# Patient Record
Sex: Female | Born: 1960 | Race: White | Hispanic: No | Marital: Married
Health system: Southern US, Community
[De-identification: ages and names within clinical notes are randomized; demographics above are authoritative.]

---

## 2005-06-03 ENCOUNTER — Other Ambulatory Visit: Admission: RE | Admit: 2005-06-03 | Discharge: 2005-06-03 | Payer: Self-pay | Admitting: Obstetrics and Gynecology

## 2006-09-23 ENCOUNTER — Encounter: Admission: RE | Admit: 2006-09-23 | Discharge: 2006-09-23 | Payer: Self-pay | Admitting: Obstetrics and Gynecology

## 2008-08-30 ENCOUNTER — Encounter: Admission: RE | Admit: 2008-08-30 | Discharge: 2008-08-30 | Payer: Self-pay | Admitting: Family Medicine

## 2008-12-19 ENCOUNTER — Encounter: Admission: RE | Admit: 2008-12-19 | Discharge: 2008-12-19 | Payer: Self-pay | Admitting: Orthopedic Surgery

## 2009-10-12 ENCOUNTER — Encounter: Admission: RE | Admit: 2009-10-12 | Discharge: 2009-10-12 | Payer: Self-pay | Admitting: Obstetrics and Gynecology

## 2010-05-22 IMAGING — CR DG MYELOGRAM LUMBAR
3 series · 3 of 3 positions shown · IV contrast (omnipaque)
Comparison: MRI 08/30/2008

CLINICAL DATA: Back pain.

 MYELOGRAM INJECTION
TECHNIQUE: Informed consent was obtained from the patient prior to
the procedure, including potential complications of headache,
allergy, infection and pain.  A timeout procedure was performed.
With the patient prone, the lower back was prepped with Betadine.
1% Lidocaine was used for local anesthesia.  Lumbar puncture was
performed at the right L3-5level using a 22 gauge needle with
return of clear CSF.  14 ml of Omnipaque 433was injected into the
subarachnoid space .
TECHNIQUE: Following injection of intrathecal Omnipaque contrast,
spine imaging in multiple projections was performed using
fluoroscopy.
Fluoroscopy Time: 1 minute 38 seconds .
TECHNIQUE: CT imaging of the lumbar spine was performed after
intrathecal contrast administration.  Multiplanar CT image
reconstructions were also generated.

[view not recorded (1 of 3)]
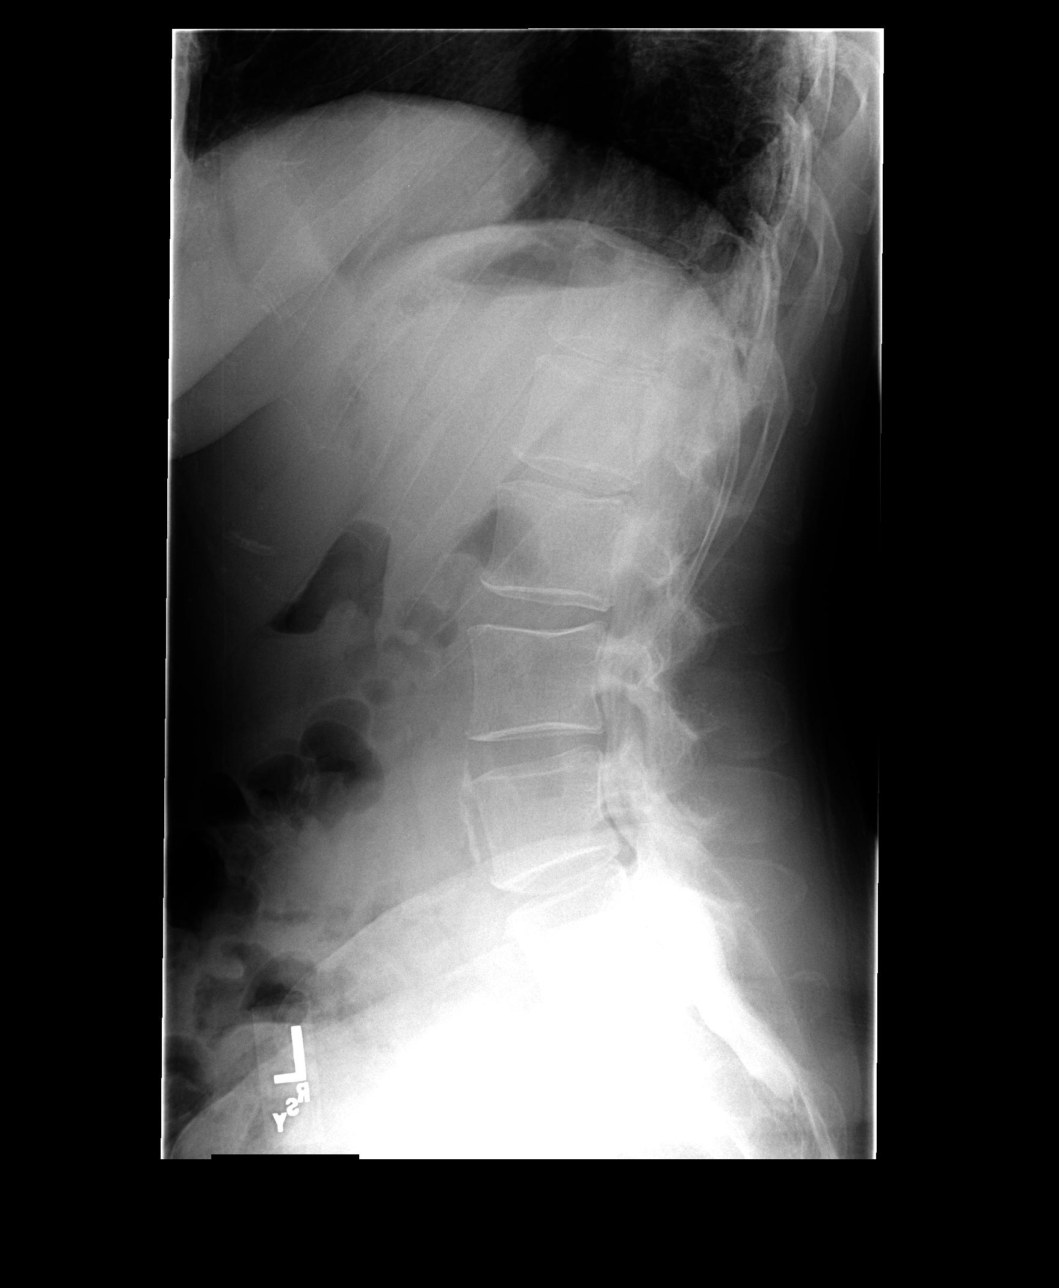

[view not recorded (2 of 3)]
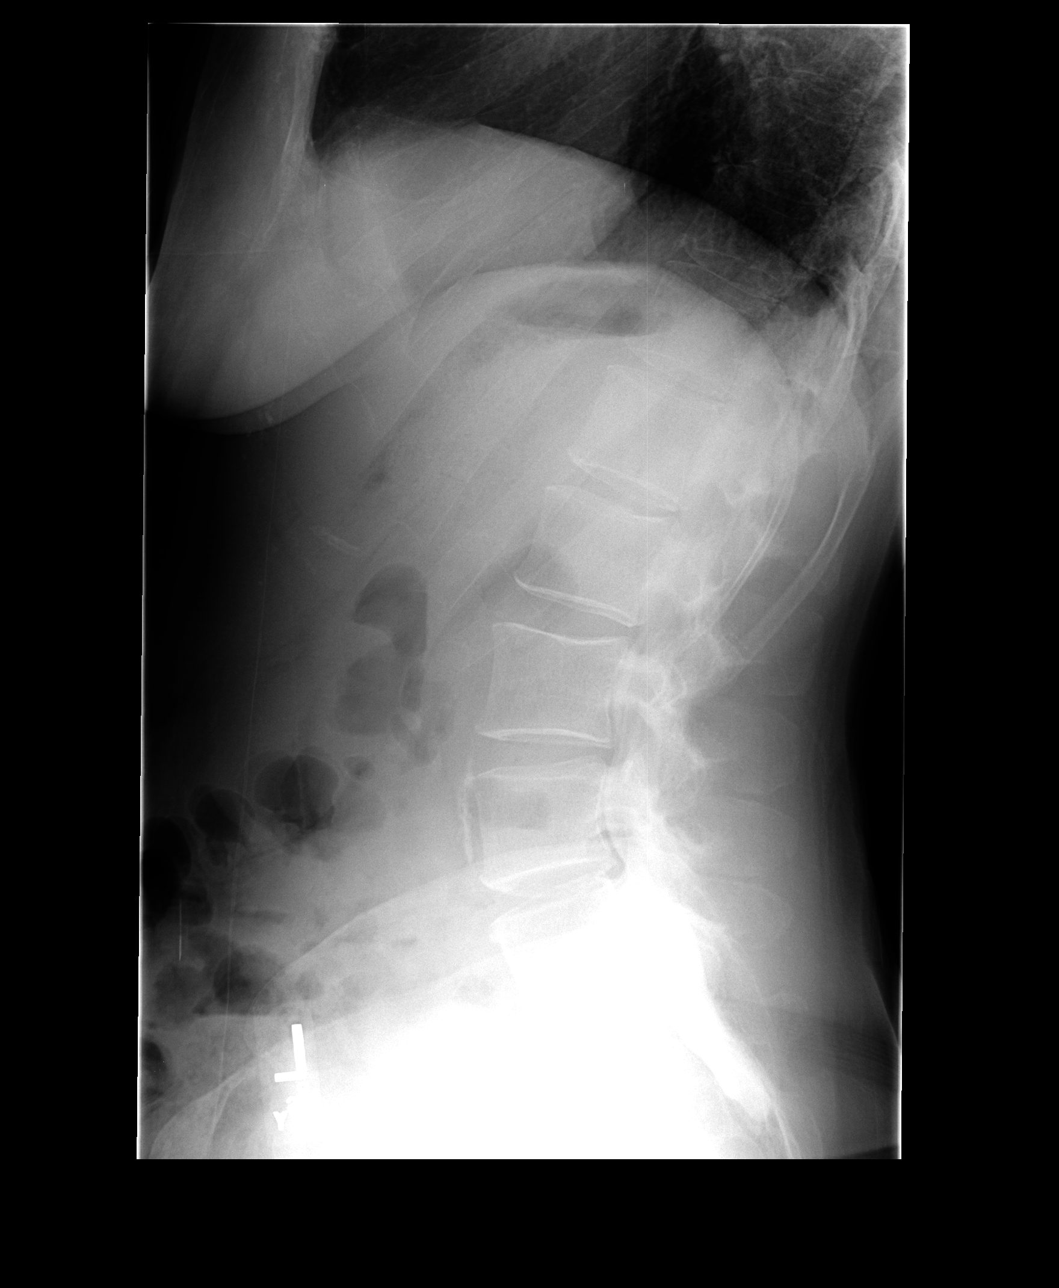

[view not recorded (3 of 3)]
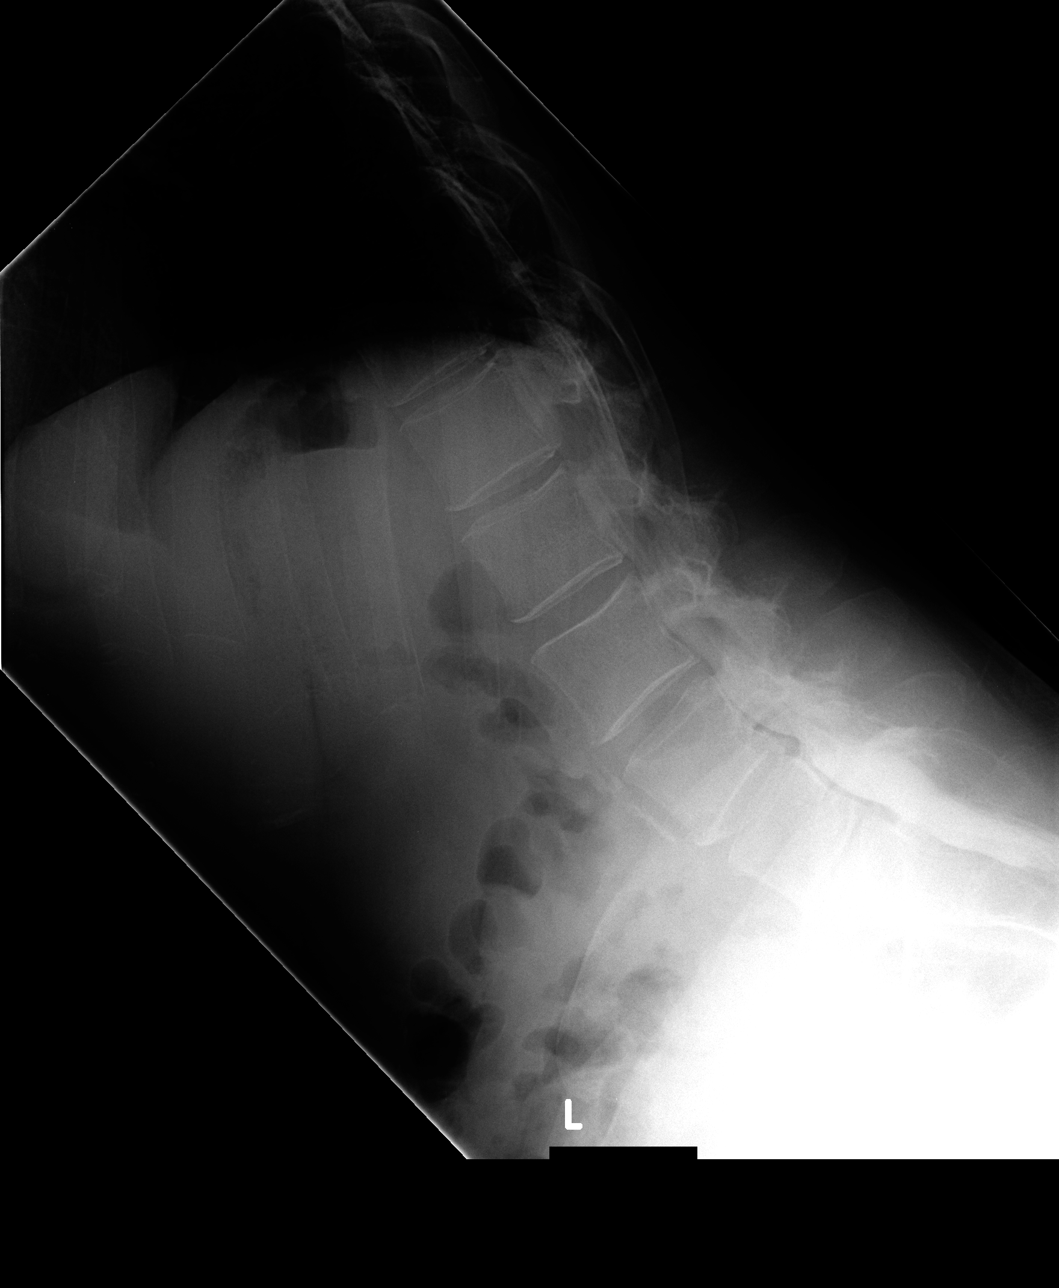

[3 of 3 positions shown; findings below may reference images not displayed]

IMPRESSION: Successful injection of  intrathecal contrast for myelography.

MYELOGRAM LUMBAR
FINDINGS: No abnormality is seen at L3-4 or above.

At L4-5, there is a moderate sized anterior extradural defect.
There is narrowing of both lateral recesses in a symmetric fashion.
Definite compression of the L5 nerve roots is not demonstrated
however.

At L5-S1, there is a small anterior extradural defect.  The left S1
root sleeve does not fill as well as the right.

Standing flexion and extension views do not show any abnormal
motion.
IMPRESSION: Moderate anterior extradural defect at L4-5 with symmetric
narrowing of the lateral recesses.

Small anterior extradural defect at L5-S1.  Slightly diminished
filling of the left S1 root sleeve.  See below.

CT MYELOGRAPHY LUMBAR SPINE
FINDINGS: There is no abnormality at T12-L1, L1-2 or L2-3.

At L3-4, there is minimal ligamentous prominence.  The canal and
foramina appear widely patent however.

At L4-5, the disc is degenerated and there is a shallow broad-based
protrusion.  The L4 nerve roots appear to exit the foramina freely.
There is mild narrowing of the lateral recesses without definite
compression of either L5 nerve root.

At L5-S1, the disc is degenerated with vacuum phenomenon.  The disc
bulges mildly.  No apparent compressive stenosis.  There is mild
foraminal narrowing bilaterally, left more than right.  Diminished
filling of the left S1 root sleeve does not appear to be on the
basis of compressive pathology.
IMPRESSION: L4-5:  Broad-based disc protrusion.  Mild narrowing of both lateral
recesses without definite neural compression.

L5-S1:  Mild bulging of the disc.  Foraminal encroachment by
osteophytes, left more than right.  No definite neural compression.

## 2010-05-22 IMAGING — CT CT L SPINE W/ CM
4 of 9 series · 13 of 33 positions shown, 15 images · IV contrast (omnipaque)
Comparison: MRI 08/30/2008

CLINICAL DATA: Back pain.

 MYELOGRAM INJECTION
TECHNIQUE: Informed consent was obtained from the patient prior to
the procedure, including potential complications of headache,
allergy, infection and pain.  A timeout procedure was performed.
With the patient prone, the lower back was prepped with Betadine.
1% Lidocaine was used for local anesthesia.  Lumbar puncture was
performed at the right L3-5level using a 22 gauge needle with
return of clear CSF.  14 ml of Omnipaque 433was injected into the
subarachnoid space .
TECHNIQUE: Following injection of intrathecal Omnipaque contrast,
spine imaging in multiple projections was performed using
fluoroscopy.
Fluoroscopy Time: 1 minute 38 seconds .
TECHNIQUE: CT imaging of the lumbar spine was performed after
intrathecal contrast administration.  Multiplanar CT image
reconstructions were also generated.

[Series 2: l spine · axial · 0.27mm/px · z∈[-175,-105]mm · 2 of 85 slices shown]
[im 29/85  bone]
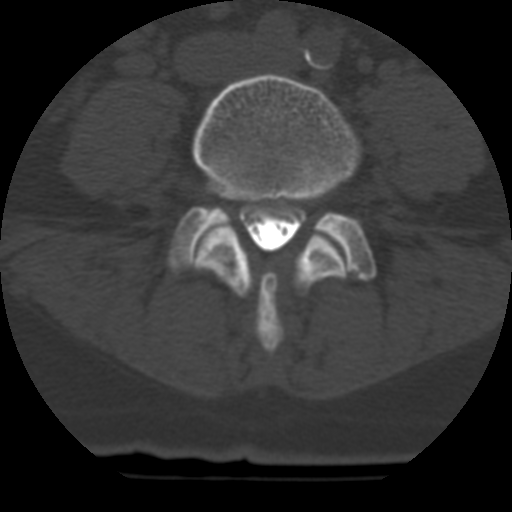
[im 57/85  bone]
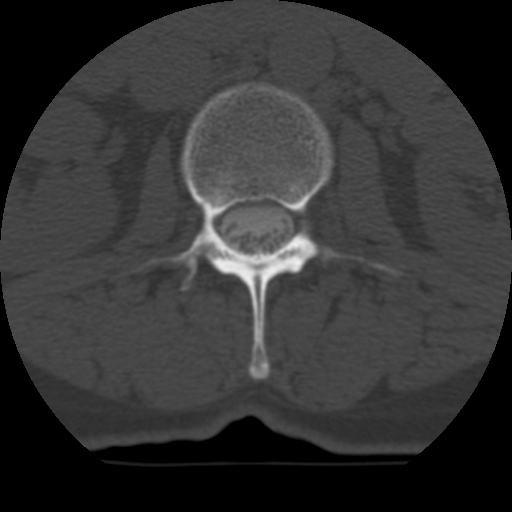

[Series 3: bone windows · axial · 0.27mm/px · z∈[-192,-87]mm · 3 of 85 slices shown, 4 images]
[im 22/85  soft-tissue]
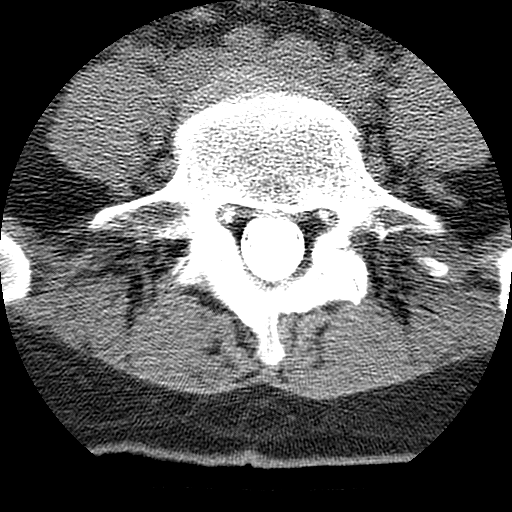
[im 22/85  bone]
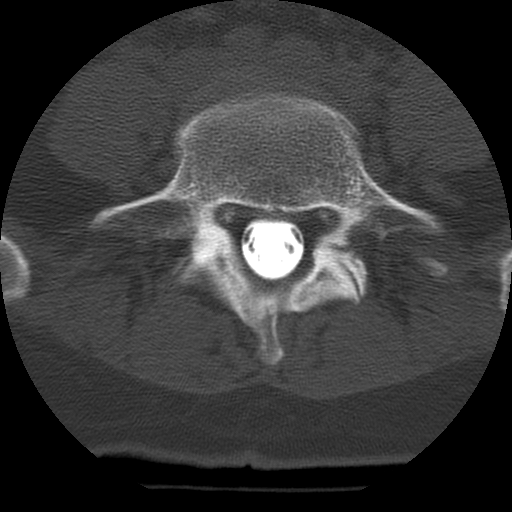
[im 43/85  bone]
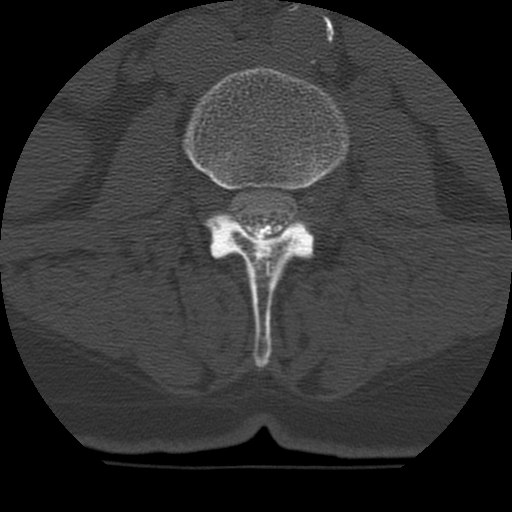
[im 64/85  bone]
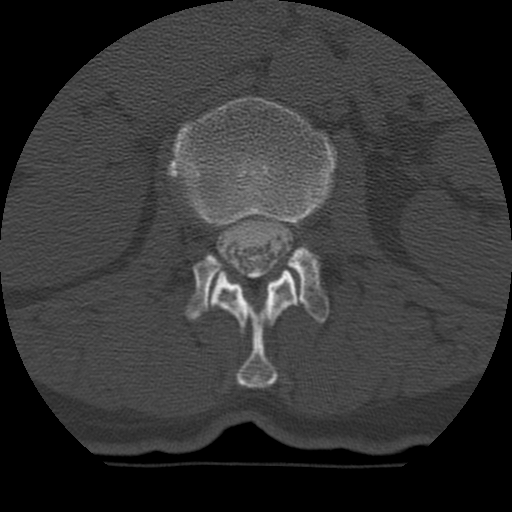

[Series 400: cor · coronal · 0.42mm/px · 3 of 40 slices shown]
[im 8/40  bone]
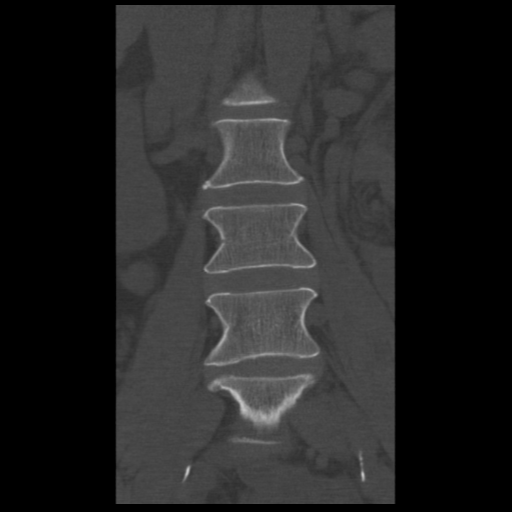
[im 16/40  bone]
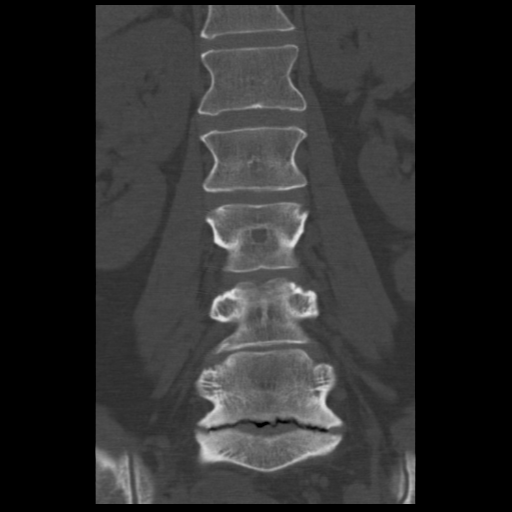
[im 24/40  bone]
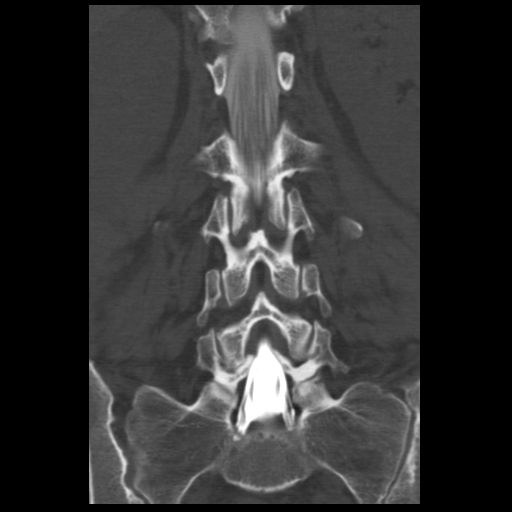

[Series 401: sag · sagittal · 0.42mm/px · 5 of 40 slices shown, 6 images]
[im 14/40  bone]
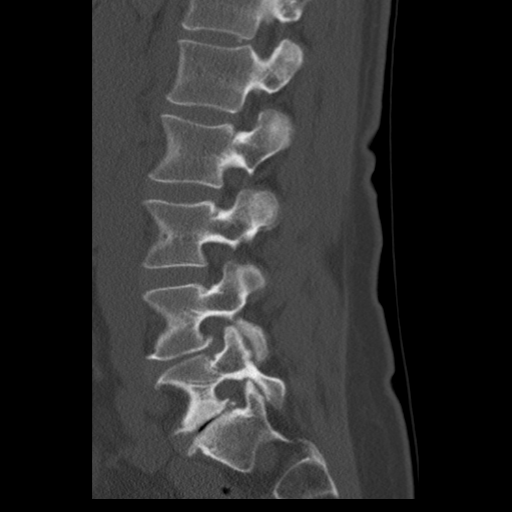
[im 17/40  bone]
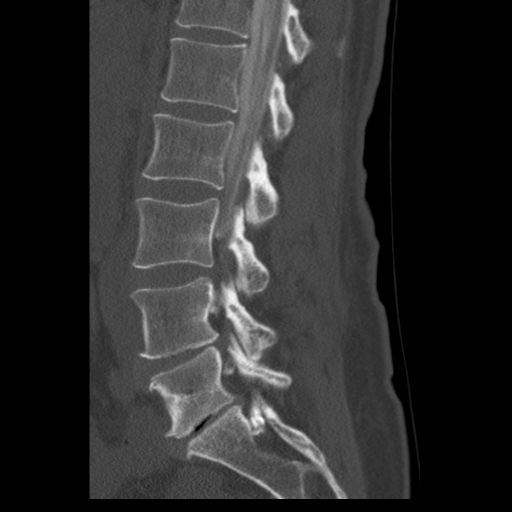
[im 20/40  soft-tissue]
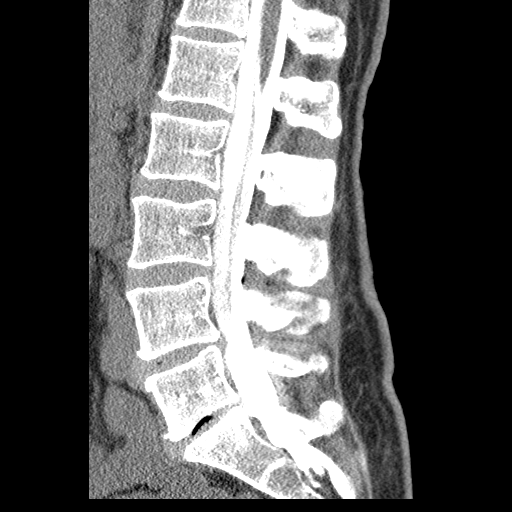
[im 20/40  bone]
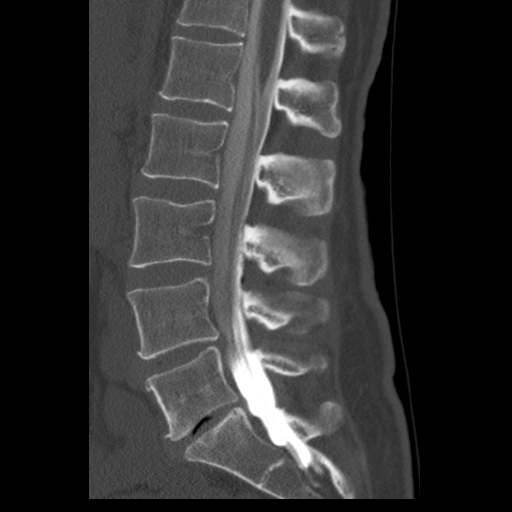
[im 23/40  bone]
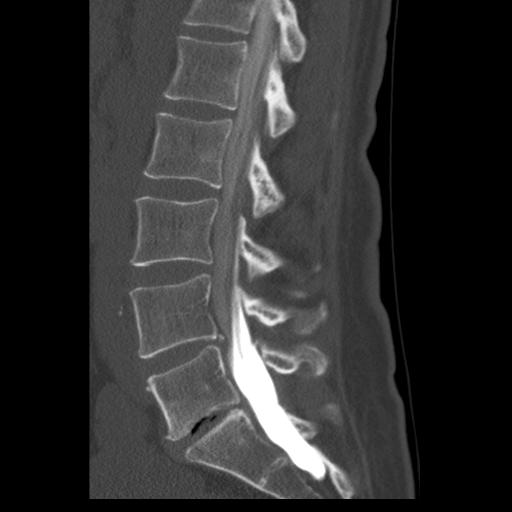
[im 27/40  bone]
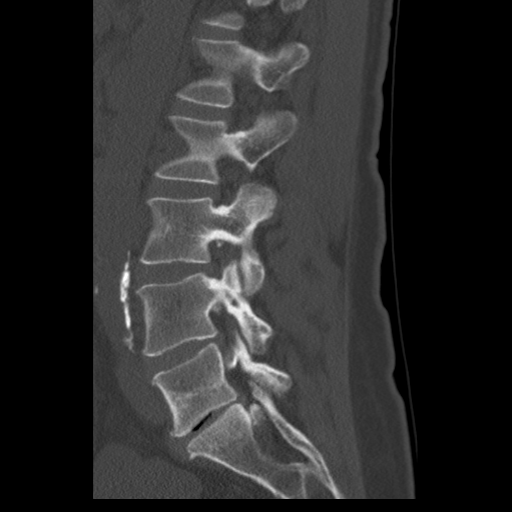

[13 of 33 positions shown; findings below may reference images not displayed]

IMPRESSION: Successful injection of  intrathecal contrast for myelography.

MYELOGRAM LUMBAR
FINDINGS: No abnormality is seen at L3-4 or above.

At L4-5, there is a moderate sized anterior extradural defect.
There is narrowing of both lateral recesses in a symmetric fashion.
Definite compression of the L5 nerve roots is not demonstrated
however.

At L5-S1, there is a small anterior extradural defect.  The left S1
root sleeve does not fill as well as the right.

Standing flexion and extension views do not show any abnormal
motion.
IMPRESSION: Moderate anterior extradural defect at L4-5 with symmetric
narrowing of the lateral recesses.

Small anterior extradural defect at L5-S1.  Slightly diminished
filling of the left S1 root sleeve.  See below.

CT MYELOGRAPHY LUMBAR SPINE
FINDINGS: There is no abnormality at T12-L1, L1-2 or L2-3.

At L3-4, there is minimal ligamentous prominence.  The canal and
foramina appear widely patent however.

At L4-5, the disc is degenerated and there is a shallow broad-based
protrusion.  The L4 nerve roots appear to exit the foramina freely.
There is mild narrowing of the lateral recesses without definite
compression of either L5 nerve root.

At L5-S1, the disc is degenerated with vacuum phenomenon.  The disc
bulges mildly.  No apparent compressive stenosis.  There is mild
foraminal narrowing bilaterally, left more than right.  Diminished
filling of the left S1 root sleeve does not appear to be on the
basis of compressive pathology.
IMPRESSION: L4-5:  Broad-based disc protrusion.  Mild narrowing of both lateral
recesses without definite neural compression.

L5-S1:  Mild bulging of the disc.  Foraminal encroachment by
osteophytes, left more than right.  No definite neural compression.

## 2010-09-01 ENCOUNTER — Encounter: Payer: Self-pay | Admitting: Obstetrics and Gynecology

## 2011-02-14 ENCOUNTER — Other Ambulatory Visit: Payer: Self-pay | Admitting: Obstetrics and Gynecology

## 2011-02-14 DIAGNOSIS — R928 Other abnormal and inconclusive findings on diagnostic imaging of breast: Secondary | ICD-10-CM

## 2011-02-20 ENCOUNTER — Ambulatory Visit
Admission: RE | Admit: 2011-02-20 | Discharge: 2011-02-20 | Disposition: A | Discharge status: Home / Self Care | Payer: BC Managed Care – PPO | Source: Ambulatory Visit | Attending: Obstetrics and Gynecology | Admitting: Obstetrics and Gynecology

## 2011-02-20 DIAGNOSIS — R928 Other abnormal and inconclusive findings on diagnostic imaging of breast: Secondary | ICD-10-CM

## 2014-04-26 ENCOUNTER — Other Ambulatory Visit: Payer: Self-pay | Admitting: Internal Medicine

## 2014-04-26 DIAGNOSIS — E785 Hyperlipidemia, unspecified: Secondary | ICD-10-CM

## 2014-05-04 ENCOUNTER — Ambulatory Visit
Admission: RE | Admit: 2014-05-04 | Discharge: 2014-05-04 | Disposition: A | Discharge status: Home / Self Care | Payer: No Typology Code available for payment source | Source: Ambulatory Visit | Attending: Internal Medicine | Admitting: Internal Medicine

## 2014-05-04 DIAGNOSIS — E785 Hyperlipidemia, unspecified: Secondary | ICD-10-CM

## 2015-03-08 ENCOUNTER — Other Ambulatory Visit: Payer: Self-pay | Admitting: Obstetrics and Gynecology

## 2015-03-08 DIAGNOSIS — R928 Other abnormal and inconclusive findings on diagnostic imaging of breast: Secondary | ICD-10-CM

## 2015-03-13 ENCOUNTER — Ambulatory Visit
Admission: RE | Admit: 2015-03-13 | Discharge: 2015-03-13 | Disposition: A | Discharge status: Home / Self Care | Payer: 59 | Source: Ambulatory Visit | Attending: Obstetrics and Gynecology | Admitting: Obstetrics and Gynecology

## 2015-03-13 DIAGNOSIS — R928 Other abnormal and inconclusive findings on diagnostic imaging of breast: Secondary | ICD-10-CM

## 2016-09-02 DIAGNOSIS — Z01419 Encounter for gynecological examination (general) (routine) without abnormal findings: Secondary | ICD-10-CM | POA: Diagnosis not present

## 2016-10-01 DIAGNOSIS — N390 Urinary tract infection, site not specified: Secondary | ICD-10-CM | POA: Diagnosis not present

## 2016-10-01 DIAGNOSIS — Z713 Dietary counseling and surveillance: Secondary | ICD-10-CM | POA: Diagnosis not present

## 2017-01-16 DIAGNOSIS — M79645 Pain in left finger(s): Secondary | ICD-10-CM | POA: Diagnosis not present

## 2017-01-16 DIAGNOSIS — M19042 Primary osteoarthritis, left hand: Secondary | ICD-10-CM | POA: Diagnosis not present

## 2017-07-03 DIAGNOSIS — I498 Other specified cardiac arrhythmias: Secondary | ICD-10-CM | POA: Diagnosis not present

## 2017-07-03 DIAGNOSIS — Z13220 Encounter for screening for lipoid disorders: Secondary | ICD-10-CM | POA: Diagnosis not present

## 2017-07-03 DIAGNOSIS — R002 Palpitations: Secondary | ICD-10-CM | POA: Diagnosis not present

## 2017-07-17 ENCOUNTER — Other Ambulatory Visit: Payer: Self-pay | Admitting: Internal Medicine

## 2017-07-17 DIAGNOSIS — R002 Palpitations: Secondary | ICD-10-CM

## 2017-07-24 ENCOUNTER — Ambulatory Visit (INDEPENDENT_AMBULATORY_CARE_PROVIDER_SITE_OTHER): Payer: 59

## 2017-07-24 DIAGNOSIS — R002 Palpitations: Secondary | ICD-10-CM | POA: Diagnosis not present

## 2017-09-04 DIAGNOSIS — Z1231 Encounter for screening mammogram for malignant neoplasm of breast: Secondary | ICD-10-CM | POA: Diagnosis not present

## 2017-11-17 DIAGNOSIS — Z0000 Encounter for general adult medical examination without abnormal findings: Secondary | ICD-10-CM | POA: Diagnosis not present

## 2017-11-17 DIAGNOSIS — Z01419 Encounter for gynecological examination (general) (routine) without abnormal findings: Secondary | ICD-10-CM | POA: Diagnosis not present

## 2017-11-17 DIAGNOSIS — Z808 Family history of malignant neoplasm of other organs or systems: Secondary | ICD-10-CM | POA: Diagnosis not present

## 2017-11-17 DIAGNOSIS — Z6829 Body mass index (BMI) 29.0-29.9, adult: Secondary | ICD-10-CM | POA: Diagnosis not present

## 2017-11-17 DIAGNOSIS — Z800 Family history of malignant neoplasm of digestive organs: Secondary | ICD-10-CM | POA: Diagnosis not present

## 2017-11-17 DIAGNOSIS — Z86010 Personal history of colonic polyps: Secondary | ICD-10-CM | POA: Diagnosis not present

## 2017-11-17 DIAGNOSIS — R82998 Other abnormal findings in urine: Secondary | ICD-10-CM | POA: Diagnosis not present

## 2017-11-17 DIAGNOSIS — E559 Vitamin D deficiency, unspecified: Secondary | ICD-10-CM | POA: Diagnosis not present

## 2017-11-24 DIAGNOSIS — E559 Vitamin D deficiency, unspecified: Secondary | ICD-10-CM | POA: Diagnosis not present

## 2017-11-24 DIAGNOSIS — Z23 Encounter for immunization: Secondary | ICD-10-CM | POA: Diagnosis not present

## 2017-11-24 DIAGNOSIS — Z0000 Encounter for general adult medical examination without abnormal findings: Secondary | ICD-10-CM | POA: Diagnosis not present

## 2017-11-24 DIAGNOSIS — E7849 Other hyperlipidemia: Secondary | ICD-10-CM | POA: Diagnosis not present

## 2017-11-24 DIAGNOSIS — I781 Nevus, non-neoplastic: Secondary | ICD-10-CM | POA: Diagnosis not present

## 2017-11-24 DIAGNOSIS — Z1389 Encounter for screening for other disorder: Secondary | ICD-10-CM | POA: Diagnosis not present

## 2017-12-15 DIAGNOSIS — Z713 Dietary counseling and surveillance: Secondary | ICD-10-CM | POA: Diagnosis not present

## 2018-06-01 DIAGNOSIS — Z809 Family history of malignant neoplasm, unspecified: Secondary | ICD-10-CM | POA: Diagnosis not present

## 2018-06-16 MED FILL — SSD 1% CREAM: 1 | 10 days supply | Qty: 25 | Fill #0

## 2019-06-22 ENCOUNTER — Other Ambulatory Visit: Payer: Self-pay | Admitting: Obstetrics and Gynecology

## 2019-06-22 DIAGNOSIS — N644 Mastodynia: Secondary | ICD-10-CM

## 2019-06-28 ENCOUNTER — Ambulatory Visit
Admission: RE | Admit: 2019-06-28 | Discharge: 2019-06-28 | Disposition: A | Discharge status: Home / Self Care | Payer: 59 | Source: Ambulatory Visit | Attending: Obstetrics and Gynecology | Admitting: Obstetrics and Gynecology

## 2019-06-28 ENCOUNTER — Other Ambulatory Visit: Payer: Self-pay

## 2019-06-28 ENCOUNTER — Ambulatory Visit: Payer: 59

## 2019-06-28 DIAGNOSIS — N644 Mastodynia: Secondary | ICD-10-CM

## 2019-07-01 ENCOUNTER — Other Ambulatory Visit: Payer: 59

## 2019-10-24 ENCOUNTER — Ambulatory Visit: Payer: 59 | Attending: Internal Medicine

## 2019-10-24 DIAGNOSIS — Z20822 Contact with and (suspected) exposure to covid-19: Secondary | ICD-10-CM

## 2019-10-25 LAB — NOVEL CORONAVIRUS, NAA: SARS-CoV-2, NAA: NOT DETECTED

## 2022-02-04 ENCOUNTER — Other Ambulatory Visit: Payer: Self-pay | Admitting: Internal Medicine

## 2022-02-04 DIAGNOSIS — E785 Hyperlipidemia, unspecified: Secondary | ICD-10-CM

## 2022-04-22 ENCOUNTER — Ambulatory Visit
Admission: RE | Admit: 2022-04-22 | Discharge: 2022-04-22 | Disposition: A | Discharge status: Home / Self Care | Payer: 59 | Source: Ambulatory Visit | Attending: Internal Medicine | Admitting: Internal Medicine

## 2022-04-22 DIAGNOSIS — E785 Hyperlipidemia, unspecified: Secondary | ICD-10-CM
